# Patient Record
Sex: Female | Born: 1965 | Race: Black or African American | Hispanic: No | Marital: Single | Smoking: Never smoker
Health system: Southern US, Community
[De-identification: ages and names within clinical notes are randomized; demographics above are authoritative.]

## PROBLEM LIST (undated history)

## (undated) DIAGNOSIS — E119 Type 2 diabetes mellitus without complications: Secondary | ICD-10-CM

## (undated) DIAGNOSIS — I10 Essential (primary) hypertension: Secondary | ICD-10-CM

## (undated) DIAGNOSIS — N289 Disorder of kidney and ureter, unspecified: Secondary | ICD-10-CM

## (undated) HISTORY — PX: KIDNEY TRANSPLANT: SHX239

---

## 2013-10-16 ENCOUNTER — Encounter (HOSPITAL_COMMUNITY): Payer: Self-pay | Admitting: Emergency Medicine

## 2013-10-16 ENCOUNTER — Telehealth (HOSPITAL_COMMUNITY): Payer: Self-pay

## 2013-10-16 ENCOUNTER — Emergency Department (HOSPITAL_COMMUNITY): Payer: Medicare Other

## 2013-10-16 ENCOUNTER — Emergency Department (HOSPITAL_COMMUNITY)
Admission: EM | Admit: 2013-10-16 | Discharge: 2013-10-16 | Disposition: A | Discharge status: Home / Self Care | Payer: Medicare Other | Attending: Emergency Medicine | Admitting: Emergency Medicine

## 2013-10-16 DIAGNOSIS — Z3202 Encounter for pregnancy test, result negative: Secondary | ICD-10-CM | POA: Insufficient documentation

## 2013-10-16 DIAGNOSIS — J069 Acute upper respiratory infection, unspecified: Secondary | ICD-10-CM | POA: Insufficient documentation

## 2013-10-16 DIAGNOSIS — Z87448 Personal history of other diseases of urinary system: Secondary | ICD-10-CM | POA: Insufficient documentation

## 2013-10-16 DIAGNOSIS — I10 Essential (primary) hypertension: Secondary | ICD-10-CM | POA: Insufficient documentation

## 2013-10-16 DIAGNOSIS — E119 Type 2 diabetes mellitus without complications: Secondary | ICD-10-CM | POA: Insufficient documentation

## 2013-10-16 DIAGNOSIS — Z880 Allergy status to penicillin: Secondary | ICD-10-CM | POA: Insufficient documentation

## 2013-10-16 HISTORY — DX: Disorder of kidney and ureter, unspecified: N28.9

## 2013-10-16 HISTORY — DX: Essential (primary) hypertension: I10

## 2013-10-16 HISTORY — DX: Type 2 diabetes mellitus without complications: E11.9

## 2013-10-16 LAB — URINE MICROSCOPIC-ADD ON

## 2013-10-16 LAB — URINALYSIS, ROUTINE W REFLEX MICROSCOPIC
Bilirubin Urine: NEGATIVE
GLUCOSE, UA: NEGATIVE mg/dL
HGB URINE DIPSTICK: NEGATIVE
Ketones, ur: NEGATIVE mg/dL
Leukocytes, UA: NEGATIVE
Nitrite: NEGATIVE
Protein, ur: 100 mg/dL — AB
SPECIFIC GRAVITY, URINE: 1.022 (ref 1.005–1.030)
UROBILINOGEN UA: 0.2 mg/dL (ref 0.0–1.0)
pH: 6 (ref 5.0–8.0)

## 2013-10-16 LAB — COMPREHENSIVE METABOLIC PANEL
ALBUMIN: 3.8 g/dL (ref 3.5–5.2)
ALT: 14 U/L (ref 0–35)
AST: 18 U/L (ref 0–37)
Alkaline Phosphatase: 75 U/L (ref 39–117)
BUN: 38 mg/dL — ABNORMAL HIGH (ref 6–23)
CO2: 26 mEq/L (ref 19–32)
CREATININE: 1.75 mg/dL — AB (ref 0.50–1.10)
Calcium: 9.6 mg/dL (ref 8.4–10.5)
Chloride: 104 mEq/L (ref 96–112)
GFR calc Af Amer: 39 mL/min — ABNORMAL LOW (ref >90)
GFR calc non Af Amer: 34 mL/min — ABNORMAL LOW (ref >90)
Glucose, Bld: 107 mg/dL — ABNORMAL HIGH (ref 70–99)
POTASSIUM: 4.5 meq/L (ref 3.7–5.3)
Sodium: 143 mEq/L (ref 137–147)
Total Bilirubin: 0.4 mg/dL (ref 0.3–1.2)
Total Protein: 7.1 g/dL (ref 6.0–8.3)

## 2013-10-16 LAB — CBC WITH DIFFERENTIAL/PLATELET
BASOS PCT: 0 % (ref 0–1)
Basophils Absolute: 0 10*3/uL (ref 0.0–0.1)
EOS ABS: 0.2 10*3/uL (ref 0.0–0.7)
EOS PCT: 3 % (ref 0–5)
HCT: 39.1 % (ref 36.0–46.0)
HEMOGLOBIN: 11.7 g/dL — AB (ref 12.0–15.0)
LYMPHS PCT: 13 % (ref 12–46)
Lymphs Abs: 0.8 10*3/uL (ref 0.7–4.0)
MCH: 23.8 pg — ABNORMAL LOW (ref 26.0–34.0)
MCHC: 29.9 g/dL — ABNORMAL LOW (ref 30.0–36.0)
MCV: 79.6 fL (ref 78.0–100.0)
Monocytes Absolute: 1.6 10*3/uL — ABNORMAL HIGH (ref 0.1–1.0)
Monocytes Relative: 27 % — ABNORMAL HIGH (ref 3–12)
NEUTROS PCT: 57 % (ref 43–77)
Neutro Abs: 3.2 10*3/uL (ref 1.7–7.7)
Platelets: 98 10*3/uL — ABNORMAL LOW (ref 150–400)
RBC: 4.91 MIL/uL (ref 3.87–5.11)
RDW: 18.1 % — ABNORMAL HIGH (ref 11.5–15.5)
WBC: 5.8 10*3/uL (ref 4.0–10.5)

## 2013-10-16 LAB — INFLUENZA PANEL BY PCR (TYPE A & B)
H1N1 flu by pcr: NOT DETECTED
INFLAPCR: NEGATIVE
INFLBPCR: POSITIVE — AB

## 2013-10-16 LAB — PREGNANCY, URINE: PREG TEST UR: NEGATIVE

## 2013-10-16 MED ORDER — ALBUTEROL SULFATE (2.5 MG/3ML) 0.083% IN NEBU
5.0000 mg | INHALATION_SOLUTION | Freq: Once | RESPIRATORY_TRACT | Status: AC
  Administered 2013-10-16: 5 mg via RESPIRATORY_TRACT
  Filled 2013-10-16: qty 6

## 2013-10-16 MED ORDER — ACETAMINOPHEN 325 MG PO TABS
650.0000 mg | ORAL_TABLET | Freq: Once | ORAL | Status: AC
  Administered 2013-10-16: 650 mg via ORAL
  Filled 2013-10-16: qty 2

## 2013-10-16 MED ORDER — IPRATROPIUM BROMIDE 0.02 % IN SOLN
0.5000 mg | Freq: Once | RESPIRATORY_TRACT | Status: AC
  Administered 2013-10-16: 0.5 mg via RESPIRATORY_TRACT
  Filled 2013-10-16: qty 2.5

## 2013-10-16 MED ORDER — OSELTAMIVIR PHOSPHATE 75 MG PO CAPS: 75.0000 mg | ORAL_CAPSULE | Freq: Two times a day (BID) | ORAL | Status: AC

## 2013-10-16 NOTE — Discharge Instructions (Signed)
Cough, Adult ° A cough is a reflex that helps clear your throat and airways. It can help heal the body or may be a reaction to an irritated airway. A cough may only last 2 or 3 weeks (acute) or may last more than 8 weeks (chronic).  °CAUSES °Acute cough: °· Viral or bacterial infections. °Chronic cough: °· Infections. °· Allergies. °· Asthma. °· Post-nasal drip. °· Smoking. °· Heartburn or acid reflux. °· Some medicines. °· Chronic lung problems (COPD). °· Cancer. °SYMPTOMS  °· Cough. °· Fever. °· Chest pain. °· Increased breathing rate. °· High-pitched whistling sound when breathing (wheezing). °· Colored mucus that you cough up (sputum). °TREATMENT  °· A bacterial cough may be treated with antibiotic medicine. °· A viral cough must run its course and will not respond to antibiotics. °· Your caregiver may recommend other treatments if you have a chronic cough. °HOME CARE INSTRUCTIONS  °· Only take over-the-counter or prescription medicines for pain, discomfort, or fever as directed by your caregiver. Use cough suppressants only as directed by your caregiver. °· Use a cold steam vaporizer or humidifier in your bedroom or home to help loosen secretions. °· Sleep in a semi-upright position if your cough is worse at night. °· Rest as needed. °· Stop smoking if you smoke. °SEEK IMMEDIATE MEDICAL CARE IF:  °· You have pus in your sputum. °· Your cough starts to worsen. °· You cannot control your cough with suppressants and are losing sleep. °· You begin coughing up blood. °· You have difficulty breathing. °· You develop pain which is getting worse or is uncontrolled with medicine. °· You have a fever. °MAKE SURE YOU:  °· Understand these instructions. °· Will watch your condition. °· Will get help right away if you are not doing well or get worse. °Document Released: 01/27/2011 Document Revised: 10/23/2011 Document Reviewed: 01/27/2011 °ExitCare® Patient Information ©2014 [employer]. ° °Upper Respiratory Infection,  Adult °An upper respiratory infection (URI) is also known as the common cold. It is often caused by a type of germ (virus). Colds are easily spread (contagious). You can pass it to others by kissing, coughing, sneezing, or drinking out of the same glass. Usually, you get better in 1 or 2 weeks.  °HOME CARE  °· Only take medicine as told by your doctor. °· Use a warm mist humidifier or breathe in steam from a hot shower. °· Drink enough water and fluids to keep your pee (urine) clear or pale yellow. °· Get plenty of rest. °· Return to work when your temperature is back to normal or as told by your doctor. You may use a face mask and wash your hands to stop your cold from spreading. °GET HELP RIGHT AWAY IF:  °· After the first few days, you feel you are getting worse. °· You have questions about your medicine. °· You have chills, shortness of breath, or brown or red spit (mucus). °· You have yellow or brown snot (nasal discharge) or pain in the face, especially when you bend forward. °· You have a fever, puffy (swollen) neck, pain when you swallow, or white spots in the back of your throat. °· You have a bad headache, ear pain, sinus pain, or chest pain. °· You have a high-pitched whistling sound when you breathe in and out (wheezing). °· You have a lasting cough or cough up blood. °· You have sore muscles or a stiff neck. °MAKE SURE YOU:  °· Understand these instructions. °· Will watch your   condition. °· Will get help right away if you are not doing well or get worse. °Document Released: 01/17/2008 Document Revised: 10/23/2011 Document Reviewed: 12/05/2010 °ExitCare® Patient Information ©2014 [employer]. ° °

## 2013-10-16 NOTE — ED Notes (Signed)
Asked by R. Browning PA to call pt and notify her (+) for Type B Flu and call Tamiflu Rx in for her.  Spoke w/ pt informed of dx and need for addl tx.  [phone] and given to RPh.

## 2013-10-16 NOTE — ED Notes (Signed)
PA wants pt moved due to hx of kidney transplant and immunocompromised with fever.

## 2013-10-16 NOTE — ED Provider Notes (Signed)
CSN: [id number]     Arrival date & time 10/16/13  [id number] History   First MD Initiated Contact with Patient 10/16/13 [phone]     Chief Complaint  Patient presents with  . Fever     (Consider location/radiation/quality/duration/timing/severity/associated sxs/prior Treatment) HPI Comments: Patient presents to the ED with a chief complaint of fever.  She is a renal transplant patient, [place]. She states that yesterday she started coughing and feeling warm.  She has not tried taking anything to alleviate her symptoms.  She states that when this happens, she is instructed to come to the ED and be evaluated.  She has been taking her medications as prescribed.  She states that the kidney has been doing well, and she denies any urinary complaints.  She is followed by Dr. Kyla Balzarineoshi and Dr. Thed[facility]Singh [facility] in [place].  Contact info is [phone].  The history is provided by the patient. No language interpreter was used.    Past Medical History  Diagnosis Date  . Renal disorder   . Diabetes mellitus without complication   . Hypertension    Past Surgical History  Procedure Laterality Date  . Kidney transplant    . Cesarean section     History reviewed. No pertinent family history. History  Substance Use Topics  . Smoking status: Never Smoker   . Smokeless tobacco: Not on file  . Alcohol Use: No   OB History   Grav Para Term Preterm Abortions TAB SAB Ect Mult Living                 Review of Systems  All other systems reviewed and are negative.      Allergies  Penicillins and Vicodin  Home Medications  No current outpatient prescriptions on file. BP 158/56  Pulse 71  Temp(Src) 100.7 F (38.2 C) (Oral)  Resp 18  Ht 5\' 5"  (1.651 m)  Wt 287 lb (130.182 kg)  BMI 47.76 kg/m2  SpO2 97% Physical Exam  Nursing note and vitals reviewed. Constitutional: She is oriented to person, place, and time. She appears well-developed and well-nourished.  HENT:  Head:  Normocephalic and atraumatic.  Eyes: Conjunctivae and EOM are normal. Pupils are equal, round, and reactive to light.  Neck: Normal range of motion. Neck supple.  Cardiovascular: Normal rate and regular rhythm.  Exam reveals no gallop and no friction rub.   No murmur heard. Pulmonary/Chest: Effort normal and breath sounds normal. No respiratory distress. She has no wheezes. She has no rales. She exhibits no tenderness.  Abdominal: Soft. Bowel sounds are normal. She exhibits no distension and no mass. There is no tenderness. There is no rebound and no guarding.  Musculoskeletal: Normal range of motion. She exhibits no edema and no tenderness.  Neurological: She is alert and oriented to person, place, and time.  Skin: Skin is warm and dry.  Psychiatric: She has a normal mood and affect. Her behavior is normal. Judgment and thought content normal.    ED Course  Procedures (including critical care time) Results for orders placed during the hospital encounter of 10/16/13  CBC WITH DIFFERENTIAL      Result Value Ref Range   WBC 5.8  4.0 - 10.5 K/uL   RBC 4.91  3.87 - 5.11 MIL/uL   Hemoglobin 11.7 (*) 12.0 - 15.0 g/dL   HCT 29.539.1  28.436.0 - 13.246.0 %   MCV 79.6  78.0 - 100.0 fL   MCH 23.8 (*) 26.0 - 34.0 pg  MCHC 29.9 (*) 30.0 - 36.0 g/dL   RDW 61.9 (*) 50.9 - 32.6 %   Platelets 98 (*) 150 - 400 K/uL   Neutrophils Relative % 57  43 - 77 %   Lymphocytes Relative 13  12 - 46 %   Monocytes Relative 27 (*) 3 - 12 %   Eosinophils Relative 3  0 - 5 %   Basophils Relative 0  0 - 1 %   Neutro Abs 3.2  1.7 - 7.7 K/uL   Lymphs Abs 0.8  0.7 - 4.0 K/uL   Monocytes Absolute 1.6 (*) 0.1 - 1.0 K/uL   Eosinophils Absolute 0.2  0.0 - 0.7 K/uL   Basophils Absolute 0.0  0.0 - 0.1 K/uL   RBC Morphology POLYCHROMASIA PRESENT     WBC Morphology ATYPICAL LYMPHOCYTES     Smear Review LARGE PLATELETS PRESENT    COMPREHENSIVE METABOLIC PANEL      Result Value Ref Range   Sodium 143  137 - 147 mEq/L   Potassium  4.5  3.7 - 5.3 mEq/L   Chloride 104  96 - 112 mEq/L   CO2 26  19 - 32 mEq/L   Glucose, Bld 107 (*) 70 - 99 mg/dL   BUN 38 (*) 6 - 23 mg/dL   Creatinine, Ser 7.12 (*) 0.50 - 1.10 mg/dL   Calcium 9.6  8.4 - 45.8 mg/dL   Total Protein 7.1  6.0 - 8.3 g/dL   Albumin 3.8  3.5 - 5.2 g/dL   AST 18  0 - 37 U/L   ALT 14  0 - 35 U/L   Alkaline Phosphatase 75  39 - 117 U/L   Total Bilirubin 0.4  0.3 - 1.2 mg/dL   GFR calc non Af Amer 34 (*) >90 mL/min   GFR calc Af Amer 39 (*) >90 mL/min  URINALYSIS, ROUTINE W REFLEX MICROSCOPIC      Result Value Ref Range   Color, Urine YELLOW  YELLOW   APPearance CLEAR  CLEAR   Specific Gravity, Urine 1.022  1.005 - 1.030   pH 6.0  5.0 - 8.0   Glucose, UA NEGATIVE  NEGATIVE mg/dL   Hgb urine dipstick NEGATIVE  NEGATIVE   Bilirubin Urine NEGATIVE  NEGATIVE   Ketones, ur NEGATIVE  NEGATIVE mg/dL   Protein, ur 099 (*) NEGATIVE mg/dL   Urobilinogen, UA 0.2  0.0 - 1.0 mg/dL   Nitrite NEGATIVE  NEGATIVE   Leukocytes, UA NEGATIVE  NEGATIVE  PREGNANCY, URINE      Result Value Ref Range   Preg Test, Ur NEGATIVE  NEGATIVE  URINE MICROSCOPIC-ADD ON      Result Value Ref Range   Squamous Epithelial / LPF RARE  RARE   WBC, UA 0-2  <3 WBC/hpf   Dg Chest 2 View  10/16/2013   CLINICAL DATA:  Cough and fever  EXAM: CHEST  2 VIEW  COMPARISON:  None.  FINDINGS: Cardiac enlargement without heart failure. Prior median sternotomy. Lungs are clear without infiltrate effusion or mass.  IMPRESSION: Cardiac enlargement without acute abnormality.   Electronically Signed   By: Marlan Palau M.D.   On: 10/16/2013 10:29      EKG Interpretation None      MDM   Final diagnoses:  None    Patient is a renal transplant patient and has a fever.  Patient seen by and discussed with Dr. Bebe Shaggy.  Will check labs, CXR, give nebs treatment and consult with her transplant team.  2:02  PM Patient discussed with Dr. Bebe Shaggy.  Patient states that she still feels tired, but  otherwise feels good.  She states that she wants to go home.  I spoke with Dr. Kyla Balzarine, the patient's nephrologist, who recommends discharging the patient with follow-up on Monday.  Dr. Kyla Balzarine advised against starting any abx or antivirals empirically, but if the PCR is positive, said it would be fine.  I have given the patient very clear return precautions.  She understands and agrees to return if her symptoms worsen.  I will follow-up on her flu PCR, which if positive, I will call in Tamiflu for the patient.  I discussed this with Dr. Bebe Shaggy, who agrees that this is the best option.  Patient is able to ambulate in the ED, oxygenation is >95%.  Patient requests to be discharged.  She is stable and ready for discharge.  Contact information:  Patient's Cell: [phone] Patient's Nephrologist (Dr. Kyla Balzarine) Pager: [phone]   3:54 PM Patient found to be influenza B positive.  I have talked with the flow manager, who will call in a prescription of Tamiflu for the patient.  Roxy Horseman, PA-C 10/16/13 1555

## 2013-10-16 NOTE — ED Notes (Signed)
Vital signs stable. 

## 2013-10-16 NOTE — ED Notes (Signed)
Pt presents with a fever starting this am, non-productive cough x2-3 days, pt states "I feel real warm on the inside."

## 2013-10-16 NOTE — ED Provider Notes (Signed)
Patient seen/examined in the Emergency Department [facility] Patient reports cough/fever Exam : awake/alert, frequent coughing, lung sounds otherwise clear Plan: pt is s/p renal transplant with fever.  Call will be placed to her transplant team in [place] BP 158/56  Pulse 71  Temp(Src) 100.7 F (38.2 C) (Oral)  Resp 18  Ht 5\' 5"  (1.651 m)  Wt 287 lb (130.182 kg)  BMI 47.76 kg/m2  SpO2 97%    Tonya Gaskinsonald W Allex Madia, MD 10/16/13 1054

## 2013-10-16 NOTE — ED Notes (Signed)
Dr. Bebe ShaggyWickline ordered pt moved to acute side.

## 2013-10-17 ENCOUNTER — Telehealth (HOSPITAL_COMMUNITY): Payer: Self-pay

## 2013-10-17 NOTE — ED Notes (Signed)
Asked by Dr Bebe ShaggyWickline to call pt and decrease Tamiflu dose to 1/2 tablet if tablets daily x 5d or 1 capsule daily x 5d due to hx of renal transplant.  Spoke w/ pt she hasn't picked up Tamiflu yet was going to pick up this am.  She reports her Temp remains elevated & she spoke w/ her MD's and was told to return.  FM advised to follow the recommendations of her MD's.

## 2013-10-17 NOTE — ED Provider Notes (Signed)
Medical screening examination/treatment/procedure(s) were conducted as a shared visit with non-physician practitioner(s) and myself.  I personally evaluated the patient during the encounter.   EKG Interpretation None       BP 141/50  Pulse 72  Temp(Src) 99.4 F (37.4 C) (Oral)  Resp 18  Ht 5\' 5"  (1.651 m)  Wt 287 lb (130.182 kg)  BMI 47.76 kg/m2  SpO2 97%   Will call in tamiflu at appropriate renal dosing for patient today (tamiflu 30mg  BID for 5 days)  Joya Gaskinsonald W Sharmayne Jablon, MD 10/17/13 [phone]

## 2013-10-17 NOTE — ED Notes (Signed)
Pt called back has decided not to return as advised by her MD's.  Spoke w/ [facility] RPh and changed Tamiflu Rx per Dr Bebe ShaggyWickline to "Tamiflu 75 mg capsule once daily x 5 days"

## 2013-10-22 LAB — CULTURE, BLOOD (ROUTINE X 2)
CULTURE: NO GROWTH
Culture: NO GROWTH

## 2014-07-19 IMAGING — CR DG CHEST 2V
2 series · 2 of 2 positions shown · non-contrast
Comparison: None.

CLINICAL DATA: Cough and fever

EXAM:
CHEST  2 VIEW

[w chest pa]
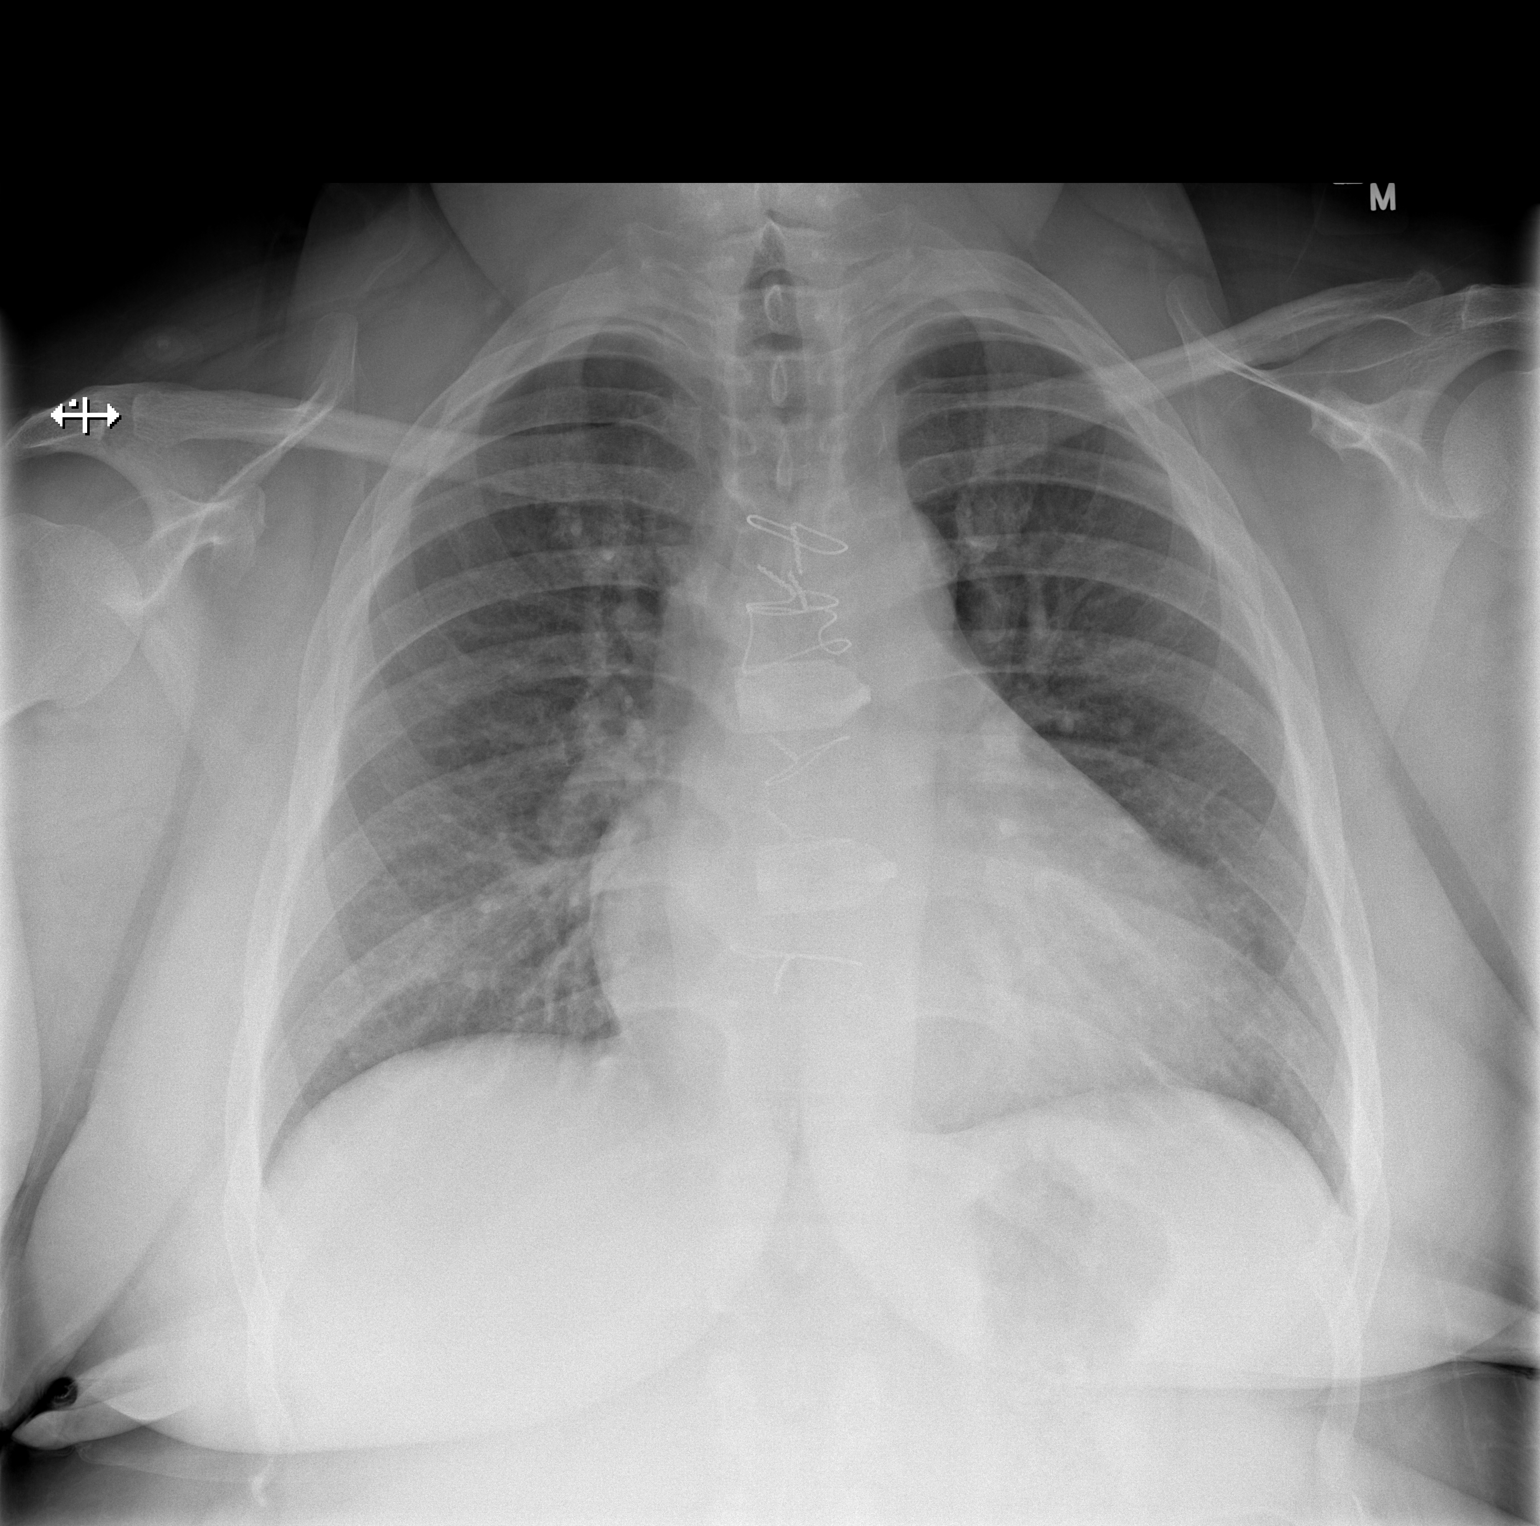

[w chest lat]
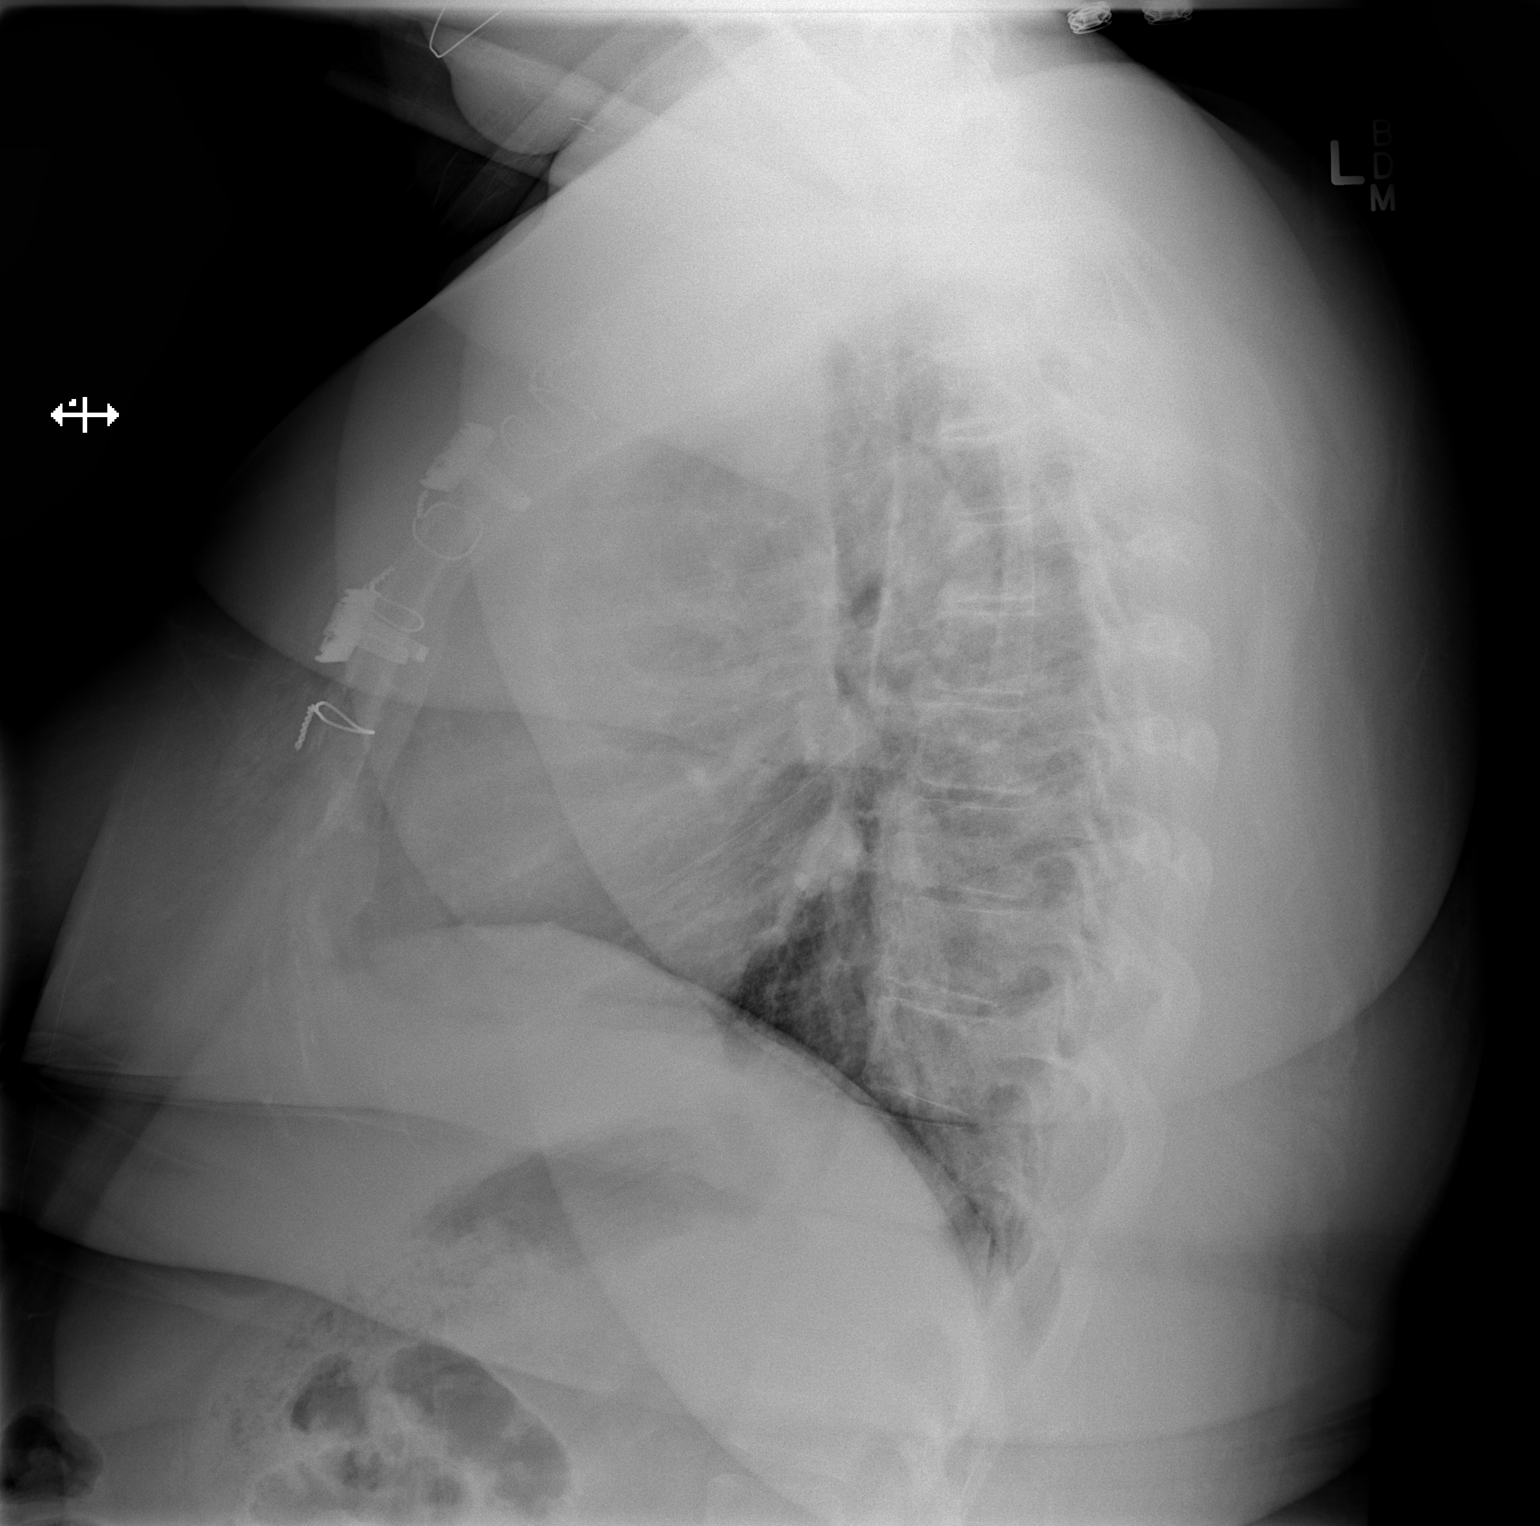

[2 of 2 positions shown; findings below may reference images not displayed]

FINDINGS: Cardiac enlargement without heart failure. Prior median sternotomy.
Lungs are clear without infiltrate effusion or mass.
IMPRESSION: Cardiac enlargement without acute abnormality.

## 2020-01-13 DEATH — deceased
